# Patient Record
Sex: Male | Born: 1980 | Race: Black or African American | Hispanic: No | Marital: Single | Smoking: Former smoker
Health system: Southern US, Community
[De-identification: ages and names within clinical notes are randomized; demographics above are authoritative.]

## PROBLEM LIST (undated history)

## (undated) DIAGNOSIS — S2249XA Multiple fractures of ribs, unspecified side, initial encounter for closed fracture: Secondary | ICD-10-CM

## (undated) DIAGNOSIS — S36113A Laceration of liver, unspecified degree, initial encounter: Secondary | ICD-10-CM

## (undated) DIAGNOSIS — J942 Hemothorax: Secondary | ICD-10-CM

## (undated) DIAGNOSIS — J869 Pyothorax without fistula: Secondary | ICD-10-CM

## (undated) DIAGNOSIS — W3400XA Accidental discharge from unspecified firearms or gun, initial encounter: Secondary | ICD-10-CM

## (undated) HISTORY — DX: Multiple fractures of ribs, unspecified side, initial encounter for closed fracture: S22.49XA

## (undated) HISTORY — DX: Pyothorax without fistula: J86.9

## (undated) HISTORY — DX: Hemothorax: J94.2

## (undated) HISTORY — DX: Accidental discharge from unspecified firearms or gun, initial encounter: W34.00XA

## (undated) HISTORY — DX: Hypocalcemia: E83.51

## (undated) HISTORY — PX: OTHER SURGICAL HISTORY: SHX169

## (undated) HISTORY — PX: DIAGNOSTIC LAPAROSCOPY: SUR761

## (undated) HISTORY — DX: Laceration of liver, unspecified degree, initial encounter: S36.113A

---
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
## 2000-07-17 ENCOUNTER — Emergency Department (HOSPITAL_COMMUNITY): Admission: EM | Admit: 2000-07-17 | Discharge: 2000-07-17 | Payer: Self-pay

## 2009-02-26 ENCOUNTER — Emergency Department (HOSPITAL_COMMUNITY): Admission: EM | Admit: 2009-02-26 | Discharge: 2009-02-26 | Payer: Self-pay | Admitting: Emergency Medicine

## 2011-03-19 ENCOUNTER — Emergency Department (HOSPITAL_COMMUNITY): Payer: BC Managed Care – PPO

## 2011-03-19 ENCOUNTER — Encounter (HOSPITAL_COMMUNITY): Payer: Self-pay

## 2011-03-19 ENCOUNTER — Inpatient Hospital Stay (HOSPITAL_COMMUNITY)
Admission: EM | Admit: 2011-03-19 | Discharge: 2011-04-03 | DRG: 793 | Disposition: A | Discharge status: Home / Self Care | Payer: BC Managed Care – PPO | Attending: Surgery | Admitting: Surgery

## 2011-03-19 DIAGNOSIS — B9689 Other specified bacterial agents as the cause of diseases classified elsewhere: Secondary | ICD-10-CM | POA: Diagnosis present

## 2011-03-19 DIAGNOSIS — K560 Paralytic ileus: Secondary | ICD-10-CM | POA: Diagnosis not present

## 2011-03-19 DIAGNOSIS — S2249XA Multiple fractures of ribs, unspecified side, initial encounter for closed fracture: Secondary | ICD-10-CM

## 2011-03-19 DIAGNOSIS — S61509A Unspecified open wound of unspecified wrist, initial encounter: Secondary | ICD-10-CM | POA: Diagnosis present

## 2011-03-19 DIAGNOSIS — S27809A Unspecified injury of diaphragm, initial encounter: Secondary | ICD-10-CM | POA: Diagnosis present

## 2011-03-19 DIAGNOSIS — S270XXA Traumatic pneumothorax, initial encounter: Secondary | ICD-10-CM

## 2011-03-19 DIAGNOSIS — S36119A Unspecified injury of liver, initial encounter: Secondary | ICD-10-CM

## 2011-03-19 DIAGNOSIS — E876 Hypokalemia: Secondary | ICD-10-CM | POA: Diagnosis present

## 2011-03-19 DIAGNOSIS — R7881 Bacteremia: Secondary | ICD-10-CM | POA: Diagnosis present

## 2011-03-19 DIAGNOSIS — N179 Acute kidney failure, unspecified: Secondary | ICD-10-CM | POA: Diagnosis not present

## 2011-03-19 DIAGNOSIS — J45909 Unspecified asthma, uncomplicated: Secondary | ICD-10-CM | POA: Diagnosis present

## 2011-03-19 DIAGNOSIS — D72829 Elevated white blood cell count, unspecified: Secondary | ICD-10-CM | POA: Diagnosis present

## 2011-03-19 DIAGNOSIS — S2190XA Unspecified open wound of unspecified part of thorax, initial encounter: Principal | ICD-10-CM | POA: Diagnosis present

## 2011-03-19 DIAGNOSIS — S36116A Major laceration of liver, initial encounter: Secondary | ICD-10-CM | POA: Diagnosis present

## 2011-03-19 DIAGNOSIS — S21309A Unspecified open wound of unspecified front wall of thorax with penetration into thoracic cavity, initial encounter: Secondary | ICD-10-CM

## 2011-03-19 DIAGNOSIS — J869 Pyothorax without fistula: Secondary | ICD-10-CM | POA: Diagnosis present

## 2011-03-19 DIAGNOSIS — K929 Disease of digestive system, unspecified: Secondary | ICD-10-CM | POA: Diagnosis not present

## 2011-03-19 DIAGNOSIS — R000 Tachycardia, unspecified: Secondary | ICD-10-CM | POA: Diagnosis not present

## 2011-03-19 DIAGNOSIS — D62 Acute posthemorrhagic anemia: Secondary | ICD-10-CM | POA: Diagnosis not present

## 2011-03-19 LAB — CBC
HCT: 38.9 % — ABNORMAL LOW (ref 39.0–52.0)
Hemoglobin: 13.7 g/dL (ref 13.0–17.0)
MCH: 27.7 pg (ref 26.0–34.0)
MCHC: 35.2 g/dL (ref 30.0–36.0)
MCV: 78.6 fL (ref 78.0–100.0)
Platelets: 207 K/uL (ref 150–400)
RBC: 4.95 MIL/uL (ref 4.22–5.81)
RDW: 12.5 % (ref 11.5–15.5)
WBC: 12.3 10*3/uL — ABNORMAL HIGH (ref 4.0–10.5)

## 2011-03-19 LAB — COMPREHENSIVE METABOLIC PANEL
Albumin: 3.4 g/dL — ABNORMAL LOW (ref 3.5–5.2)
BUN: 16 mg/dL (ref 6–23)
Calcium: 7.8 mg/dL — ABNORMAL LOW (ref 8.4–10.5)
Creatinine, Ser: 1.06 mg/dL (ref 0.50–1.35)
Total Bilirubin: 0.3 mg/dL (ref 0.3–1.2)
Total Protein: 6 g/dL (ref 6.0–8.3)

## 2011-03-19 LAB — COMPREHENSIVE METABOLIC PANEL WITH GFR
ALT: 97 U/L — ABNORMAL HIGH (ref 0–53)
AST: 81 U/L — ABNORMAL HIGH (ref 0–37)
Alkaline Phosphatase: 61 U/L (ref 39–117)
CO2: 24 meq/L (ref 19–32)
Chloride: 104 meq/L (ref 96–112)
GFR calc Af Amer: >60 mL/min (ref >60)
GFR calc non Af Amer: >60 mL/min (ref >60)
Glucose, Bld: 130 mg/dL — ABNORMAL HIGH (ref 70–99)
Potassium: 3 meq/L — ABNORMAL LOW (ref 3.5–5.1)
Sodium: 138 meq/L (ref 135–145)

## 2011-03-19 LAB — ABO/RH: ABO/RH(D): B POS

## 2011-03-19 LAB — POCT I-STAT, CHEM 8
Calcium, Ion: 1.09 mmol/L — ABNORMAL LOW (ref 1.12–1.32)
Glucose, Bld: 129 mg/dL — ABNORMAL HIGH (ref 70–99)
HCT: 42 % (ref 39.0–52.0)
Hemoglobin: 14.3 g/dL (ref 13.0–17.0)
Potassium: 3.1 meq/L — ABNORMAL LOW (ref 3.5–5.1)

## 2011-03-19 LAB — PROTIME-INR
INR: 1.15 (ref 0.00–1.49)
Prothrombin Time: 14.9 seconds (ref 11.6–15.2)

## 2011-03-19 MED ORDER — IOHEXOL 300 MG/ML  SOLN
80.0000 mL | Freq: Once | INTRAMUSCULAR | Status: AC | PRN
  Administered 2011-03-19: 80 mL via INTRAVENOUS

## 2011-03-20 ENCOUNTER — Inpatient Hospital Stay (HOSPITAL_COMMUNITY): Payer: BC Managed Care – PPO

## 2011-03-20 DIAGNOSIS — E876 Hypokalemia: Secondary | ICD-10-CM

## 2011-03-20 DIAGNOSIS — R509 Fever, unspecified: Secondary | ICD-10-CM

## 2011-03-20 DIAGNOSIS — D62 Acute posthemorrhagic anemia: Secondary | ICD-10-CM

## 2011-03-20 LAB — CBC
Hemoglobin: 11.5 g/dL — ABNORMAL LOW (ref 13.0–17.0)
MCHC: 34 g/dL (ref 30.0–36.0)
RBC: 4.33 MIL/uL (ref 4.22–5.81)
WBC: 14.5 10*3/uL — ABNORMAL HIGH (ref 4.0–10.5)

## 2011-03-20 LAB — MRSA PCR SCREENING: MRSA by PCR: NEGATIVE

## 2011-03-20 LAB — BASIC METABOLIC PANEL
Chloride: 104 mEq/L (ref 96–112)
Creatinine, Ser: 0.96 mg/dL (ref 0.50–1.35)
GFR calc Af Amer: >60 mL/min (ref >60)

## 2011-03-20 LAB — PROTIME-INR
INR: 1.11 (ref 0.00–1.49)
Prothrombin Time: 14.5 seconds (ref 11.6–15.2)

## 2011-03-20 LAB — APTT: aPTT: 23 seconds — ABNORMAL LOW (ref 24–37)

## 2011-03-21 ENCOUNTER — Inpatient Hospital Stay (HOSPITAL_COMMUNITY): Payer: BC Managed Care – PPO

## 2011-03-22 ENCOUNTER — Inpatient Hospital Stay (HOSPITAL_COMMUNITY): Payer: BC Managed Care – PPO

## 2011-03-22 DIAGNOSIS — R0602 Shortness of breath: Secondary | ICD-10-CM

## 2011-03-22 DIAGNOSIS — R000 Tachycardia, unspecified: Secondary | ICD-10-CM

## 2011-03-22 LAB — BASIC METABOLIC PANEL
BUN: 7 mg/dL (ref 6–23)
CO2: 31 mEq/L (ref 19–32)
Chloride: 98 mEq/L (ref 96–112)
Creatinine, Ser: 0.86 mg/dL (ref 0.50–1.35)
GFR calc Af Amer: >60 mL/min (ref >60)
Potassium: 3.8 mEq/L (ref 3.5–5.1)

## 2011-03-22 LAB — URINALYSIS, ROUTINE W REFLEX MICROSCOPIC
Bilirubin Urine: NEGATIVE
Ketones, ur: NEGATIVE mg/dL
Nitrite: NEGATIVE
Specific Gravity, Urine: 1.02 (ref 1.005–1.030)
Urobilinogen, UA: 1 mg/dL (ref 0.0–1.0)

## 2011-03-22 LAB — CBC
HCT: 29.3 % — ABNORMAL LOW (ref 39.0–52.0)
HCT: 27 % — ABNORMAL LOW (ref 39.0–52.0)
Hemoglobin: 9.8 g/dL — ABNORMAL LOW (ref 13.0–17.0)
MCH: 26.5 pg (ref 26.0–34.0)
MCV: 78.8 fL (ref 78.0–100.0)
MCV: 77.8 fL — ABNORMAL LOW (ref 78.0–100.0)
RBC: 3.72 MIL/uL — ABNORMAL LOW (ref 4.22–5.81)
RBC: 3.47 MIL/uL — ABNORMAL LOW (ref 4.22–5.81)
RDW: 12.3 % (ref 11.5–15.5)
RDW: 12.3 % (ref 11.5–15.5)
WBC: 11.8 10*3/uL — ABNORMAL HIGH (ref 4.0–10.5)
WBC: 14.5 10*3/uL — ABNORMAL HIGH (ref 4.0–10.5)
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
## 2011-03-23 ENCOUNTER — Inpatient Hospital Stay (HOSPITAL_COMMUNITY): Payer: BC Managed Care – PPO

## 2011-03-23 LAB — TYPE AND SCREEN
Antibody Screen: NEGATIVE
Unit division: 0
Unit division: 0
Unit division: 0

## 2011-03-23 LAB — CBC
HCT: 25.8 % — ABNORMAL LOW (ref 39.0–52.0)
Hemoglobin: 8.8 g/dL — ABNORMAL LOW (ref 13.0–17.0)
MCH: 26.6 pg (ref 26.0–34.0)
MCHC: 34.1 g/dL (ref 30.0–36.0)
RDW: 12.1 % (ref 11.5–15.5)

## 2011-03-23 LAB — URINE CULTURE: Culture: NO GROWTH

## 2011-03-23 LAB — CULTURE, BLOOD (ROUTINE X 2): Culture  Setup Time: 201209061016

## 2011-03-24 ENCOUNTER — Inpatient Hospital Stay (HOSPITAL_COMMUNITY): Payer: BC Managed Care – PPO

## 2011-03-24 LAB — CBC
Hemoglobin: 8.2 g/dL — ABNORMAL LOW (ref 13.0–17.0)
RBC: 3.07 MIL/uL — ABNORMAL LOW (ref 4.22–5.81)
WBC: 11.5 10*3/uL — ABNORMAL HIGH (ref 4.0–10.5)

## 2011-03-24 LAB — BASIC METABOLIC PANEL
CO2: 29 mEq/L (ref 19–32)
Chloride: 103 mEq/L (ref 96–112)
GFR calc non Af Amer: >60 mL/min (ref >60)
Glucose, Bld: 103 mg/dL — ABNORMAL HIGH (ref 70–99)
Potassium: 3.3 mEq/L — ABNORMAL LOW (ref 3.5–5.1)
Sodium: 136 mEq/L (ref 135–145)

## 2011-03-25 ENCOUNTER — Inpatient Hospital Stay (HOSPITAL_COMMUNITY): Payer: BC Managed Care – PPO

## 2011-03-25 DIAGNOSIS — R042 Hemoptysis: Secondary | ICD-10-CM

## 2011-03-25 LAB — CBC
HCT: 25.2 % — ABNORMAL LOW (ref 39.0–52.0)
Hemoglobin: 8.7 g/dL — ABNORMAL LOW (ref 13.0–17.0)
MCV: 75.9 fL — ABNORMAL LOW (ref 78.0–100.0)
Platelets: 297 10*3/uL (ref 150–400)
RBC: 3.32 MIL/uL — ABNORMAL LOW (ref 4.22–5.81)
WBC: 11.1 10*3/uL — ABNORMAL HIGH (ref 4.0–10.5)

## 2011-03-25 LAB — BASIC METABOLIC PANEL
CO2: 28 mEq/L (ref 19–32)
Chloride: 101 mEq/L (ref 96–112)
Creatinine, Ser: 0.93 mg/dL (ref 0.50–1.35)

## 2011-03-25 MED ORDER — IOHEXOL 300 MG/ML  SOLN: 100.0000 mL | Freq: Once | INTRAMUSCULAR | Status: AC | PRN

## 2011-03-26 DIAGNOSIS — J869 Pyothorax without fistula: Secondary | ICD-10-CM

## 2011-03-26 LAB — CBC
HCT: 24.5 % — ABNORMAL LOW (ref 39.0–52.0)
MCV: 75.4 fL — ABNORMAL LOW (ref 78.0–100.0)
RDW: 12.4 % (ref 11.5–15.5)
WBC: 15.9 10*3/uL — ABNORMAL HIGH (ref 4.0–10.5)

## 2011-03-26 LAB — BASIC METABOLIC PANEL
BUN: 7 mg/dL (ref 6–23)
CO2: 30 mEq/L (ref 19–32)
Chloride: 102 mEq/L (ref 96–112)
Creatinine, Ser: 0.95 mg/dL (ref 0.50–1.35)
GFR calc Af Amer: >60 mL/min (ref >60)

## 2011-03-27 ENCOUNTER — Inpatient Hospital Stay (HOSPITAL_COMMUNITY): Payer: BC Managed Care – PPO

## 2011-03-27 LAB — CBC
HCT: 24.6 % — ABNORMAL LOW (ref 39.0–52.0)
MCH: 26.1 pg (ref 26.0–34.0)
MCHC: 34.6 g/dL (ref 30.0–36.0)
MCV: 75.5 fL — ABNORMAL LOW (ref 78.0–100.0)
RDW: 12.6 % (ref 11.5–15.5)

## 2011-03-28 DIAGNOSIS — J869 Pyothorax without fistula: Secondary | ICD-10-CM

## 2011-03-28 DIAGNOSIS — J90 Pleural effusion, not elsewhere classified: Secondary | ICD-10-CM

## 2011-03-29 ENCOUNTER — Other Ambulatory Visit: Payer: Self-pay | Admitting: Cardiothoracic Surgery

## 2011-03-29 ENCOUNTER — Inpatient Hospital Stay (HOSPITAL_COMMUNITY): Payer: BC Managed Care – PPO

## 2011-03-29 DIAGNOSIS — J869 Pyothorax without fistula: Secondary | ICD-10-CM

## 2011-03-29 HISTORY — PX: OTHER SURGICAL HISTORY: SHX169

## 2011-03-29 LAB — CBC
HCT: 27.2 % — ABNORMAL LOW (ref 39.0–52.0)
Hemoglobin: 9.4 g/dL — ABNORMAL LOW (ref 13.0–17.0)
MCH: 26.5 pg (ref 26.0–34.0)
MCHC: 34.6 g/dL (ref 30.0–36.0)
MCV: 76.6 fL — ABNORMAL LOW (ref 78.0–100.0)
Platelets: 579 10*3/uL — ABNORMAL HIGH (ref 150–400)
RBC: 3.55 MIL/uL — ABNORMAL LOW (ref 4.22–5.81)
RDW: 13.7 % (ref 11.5–15.5)
WBC: 24.9 10*3/uL — ABNORMAL HIGH (ref 4.0–10.5)

## 2011-03-29 LAB — BLOOD GAS, ARTERIAL
Acid-Base Excess: 0.2 mmol/L (ref 0.0–2.0)
Bicarbonate: 24.3 mEq/L — ABNORMAL HIGH (ref 20.0–24.0)
Drawn by: 258031
O2 Content: 4 L/min
O2 Saturation: 91.8 %
Patient temperature: 98.6
TCO2: 25.5 mmol/L (ref 0–100)
pCO2 arterial: 39.2 mmHg (ref 35.0–45.0)
pH, Arterial: 7.409 (ref 7.350–7.450)
pO2, Arterial: 59.3 mmHg — ABNORMAL LOW (ref 80.0–100.0)

## 2011-03-30 ENCOUNTER — Inpatient Hospital Stay (HOSPITAL_COMMUNITY): Payer: BC Managed Care – PPO

## 2011-03-30 LAB — BASIC METABOLIC PANEL
BUN: 14 mg/dL (ref 6–23)
CO2: 27 mEq/L (ref 19–32)
Calcium: 7.2 mg/dL — ABNORMAL LOW (ref 8.4–10.5)
Chloride: 103 mEq/L (ref 96–112)
Creatinine, Ser: 2.05 mg/dL — ABNORMAL HIGH (ref 0.50–1.35)
GFR calc Af Amer: 46 mL/min — ABNORMAL LOW (ref >60)
GFR calc non Af Amer: 38 mL/min — ABNORMAL LOW (ref >60)
Glucose, Bld: 102 mg/dL — ABNORMAL HIGH (ref 70–99)
Potassium: 3.6 mEq/L (ref 3.5–5.1)
Sodium: 136 mEq/L (ref 135–145)

## 2011-03-30 LAB — CBC
HCT: 23.3 % — ABNORMAL LOW (ref 39.0–52.0)
Hemoglobin: 8 g/dL — ABNORMAL LOW (ref 13.0–17.0)
MCH: 26.8 pg (ref 26.0–34.0)
MCHC: 34.3 g/dL (ref 30.0–36.0)
MCV: 78.2 fL (ref 78.0–100.0)
Platelets: 522 10*3/uL — ABNORMAL HIGH (ref 150–400)
RBC: 2.98 MIL/uL — ABNORMAL LOW (ref 4.22–5.81)
RDW: 13.9 % (ref 11.5–15.5)
WBC: 22.7 10*3/uL — ABNORMAL HIGH (ref 4.0–10.5)

## 2011-03-30 LAB — POCT I-STAT 3, ART BLOOD GAS (G3+)
Acid-Base Excess: 2 mmol/L (ref 0.0–2.0)
Bicarbonate: 26.8 meq/L — ABNORMAL HIGH (ref 20.0–24.0)
Patient temperature: 98.9
TCO2: 28 mmol/L (ref 0–100)

## 2011-03-31 ENCOUNTER — Inpatient Hospital Stay (HOSPITAL_COMMUNITY): Payer: BC Managed Care – PPO

## 2011-03-31 LAB — CBC
HCT: 26.5 % — ABNORMAL LOW (ref 39.0–52.0)
Hemoglobin: 8.9 g/dL — ABNORMAL LOW (ref 13.0–17.0)
MCH: 26.6 pg (ref 26.0–34.0)
MCHC: 33.6 g/dL (ref 30.0–36.0)
MCV: 79.1 fL (ref 78.0–100.0)
Platelets: 526 10*3/uL — ABNORMAL HIGH (ref 150–400)
RBC: 3.35 MIL/uL — ABNORMAL LOW (ref 4.22–5.81)
RDW: 14.1 % (ref 11.5–15.5)
WBC: 23.1 10*3/uL — ABNORMAL HIGH (ref 4.0–10.5)

## 2011-03-31 LAB — CROSSMATCH
ABO/RH(D): B POS
Antibody Screen: NEGATIVE
Unit division: 0
Unit division: 0

## 2011-03-31 LAB — COMPREHENSIVE METABOLIC PANEL
ALT: 61 U/L — ABNORMAL HIGH (ref 0–53)
AST: 62 U/L — ABNORMAL HIGH (ref 0–37)
Albumin: 1.4 g/dL — ABNORMAL LOW (ref 3.5–5.2)
Alkaline Phosphatase: 70 U/L (ref 39–117)
BUN: 11 mg/dL (ref 6–23)
CO2: 30 mEq/L (ref 19–32)
Calcium: 7.4 mg/dL — ABNORMAL LOW (ref 8.4–10.5)
Chloride: 104 mEq/L (ref 96–112)
Creatinine, Ser: 2.01 mg/dL — ABNORMAL HIGH (ref 0.50–1.35)
GFR calc Af Amer: 47 mL/min — ABNORMAL LOW (ref >60)
GFR calc non Af Amer: 39 mL/min — ABNORMAL LOW (ref >60)
Glucose, Bld: 126 mg/dL — ABNORMAL HIGH (ref 70–99)
Potassium: 3.4 mEq/L — ABNORMAL LOW (ref 3.5–5.1)
Sodium: 137 mEq/L (ref 135–145)
Total Bilirubin: 0.3 mg/dL (ref 0.3–1.2)
Total Protein: 5.3 g/dL — ABNORMAL LOW (ref 6.0–8.3)

## 2011-04-01 ENCOUNTER — Inpatient Hospital Stay (HOSPITAL_COMMUNITY): Payer: BC Managed Care – PPO

## 2011-04-01 LAB — RENAL FUNCTION PANEL
BUN: 9 mg/dL (ref 6–23)
Calcium: 7.6 mg/dL — ABNORMAL LOW (ref 8.4–10.5)
Glucose, Bld: 98 mg/dL (ref 70–99)
Phosphorus: 2.5 mg/dL (ref 2.3–4.6)

## 2011-04-01 LAB — CBC
HCT: 25.8 % — ABNORMAL LOW (ref 39.0–52.0)
Hemoglobin: 8.8 g/dL — ABNORMAL LOW (ref 13.0–17.0)
MCH: 26.7 pg (ref 26.0–34.0)
MCHC: 34.1 g/dL (ref 30.0–36.0)

## 2011-04-02 ENCOUNTER — Inpatient Hospital Stay (HOSPITAL_COMMUNITY): Payer: BC Managed Care – PPO

## 2011-04-02 LAB — CBC
HCT: 21.9 % — ABNORMAL LOW (ref 39.0–52.0)
MCHC: 33.8 g/dL (ref 30.0–36.0)
MCV: 78.2 fL (ref 78.0–100.0)
RDW: 14.1 % (ref 11.5–15.5)

## 2011-04-02 LAB — BODY FLUID CULTURE: Culture: NO GROWTH

## 2011-04-02 LAB — BASIC METABOLIC PANEL
BUN: 8 mg/dL (ref 6–23)
Chloride: 114 mEq/L — ABNORMAL HIGH (ref 96–112)
Creatinine, Ser: 1.47 mg/dL — ABNORMAL HIGH (ref 0.50–1.35)
GFR calc Af Amer: >60 mL/min (ref >60)
GFR calc non Af Amer: 56 mL/min — ABNORMAL LOW (ref >60)

## 2011-04-02 LAB — CALCIUM, IONIZED: Calcium, Ion: 1.21 mmol/L (ref 1.12–1.32)

## 2011-04-03 ENCOUNTER — Inpatient Hospital Stay (HOSPITAL_COMMUNITY): Payer: BC Managed Care – PPO

## 2011-04-03 LAB — ANAEROBIC CULTURE

## 2011-04-03 LAB — BASIC METABOLIC PANEL
BUN: 9 mg/dL (ref 6–23)
Chloride: 104 mEq/L (ref 96–112)
GFR calc Af Amer: 54 mL/min — ABNORMAL LOW (ref >60)
Potassium: 3.9 mEq/L (ref 3.5–5.1)

## 2011-04-03 LAB — CBC
HCT: 25.7 % — ABNORMAL LOW (ref 39.0–52.0)
Platelets: 740 10*3/uL — ABNORMAL HIGH (ref 150–400)
RDW: 14 % (ref 11.5–15.5)
WBC: 15.3 10*3/uL — ABNORMAL HIGH (ref 4.0–10.5)
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
## 2011-04-09 ENCOUNTER — Ambulatory Visit (INDEPENDENT_AMBULATORY_CARE_PROVIDER_SITE_OTHER): Payer: Self-pay | Admitting: *Deleted

## 2011-04-09 ENCOUNTER — Other Ambulatory Visit: Payer: Self-pay | Admitting: Cardiothoracic Surgery

## 2011-04-09 VITALS — BP 126/72 | HR 72 | Resp 16 | Ht 69.0 in | Wt 160.0 lb

## 2011-04-09 DIAGNOSIS — D381 Neoplasm of uncertain behavior of trachea, bronchus and lung: Secondary | ICD-10-CM

## 2011-04-09 DIAGNOSIS — J869 Pyothorax without fistula: Secondary | ICD-10-CM

## 2011-04-10 DIAGNOSIS — J942 Hemothorax: Secondary | ICD-10-CM

## 2011-04-10 DIAGNOSIS — S2249XA Multiple fractures of ribs, unspecified side, initial encounter for closed fracture: Secondary | ICD-10-CM

## 2011-04-10 DIAGNOSIS — J869 Pyothorax without fistula: Secondary | ICD-10-CM

## 2011-04-10 DIAGNOSIS — S36113A Laceration of liver, unspecified degree, initial encounter: Secondary | ICD-10-CM | POA: Insufficient documentation

## 2011-04-10 DIAGNOSIS — W3400XA Accidental discharge from unspecified firearms or gun, initial encounter: Secondary | ICD-10-CM

## 2011-04-11 ENCOUNTER — Encounter: Payer: Self-pay | Admitting: *Deleted

## 2011-04-11 ENCOUNTER — Ambulatory Visit: Payer: Self-pay | Admitting: Cardiothoracic Surgery

## 2011-04-11 ENCOUNTER — Ambulatory Visit: Payer: BC Managed Care – PPO | Admitting: Cardiothoracic Surgery

## 2011-04-11 ENCOUNTER — Other Ambulatory Visit: Payer: Self-pay | Admitting: *Deleted

## 2011-04-11 NOTE — Progress Notes (Signed)
Craig Santana returns for staple and suture removal of Right VATS site and CT sites x 2. There is also evidence of his laparotomy incision for repair of abdominal organ injuries by another surgeon. I removed the staples and sutures without difficulty. All sites are healing well except for one ct site. The suture was either removed or it has loosened on it's own----the site is open with old clots.  I cleansed the area with Peroxide, then flushed with normal saline, then packed with 1/4" plain nuguaze.  I instructed his sister to do this everyday until seen in followup. They will notify the office for any s/s of infection. Lortab 7.5/500 one or two every 4-6 hrs prn pain called to [facility] [address].

## 2011-04-17 ENCOUNTER — Other Ambulatory Visit: Payer: Self-pay | Admitting: Cardiothoracic Surgery

## 2011-04-17 DIAGNOSIS — J869 Pyothorax without fistula: Secondary | ICD-10-CM
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
## 2011-04-25 ENCOUNTER — Ambulatory Visit (INDEPENDENT_AMBULATORY_CARE_PROVIDER_SITE_OTHER): Payer: Self-pay | Admitting: Cardiothoracic Surgery

## 2011-04-25 ENCOUNTER — Encounter: Payer: Self-pay | Admitting: Cardiothoracic Surgery

## 2011-04-25 ENCOUNTER — Ambulatory Visit
Admission: RE | Admit: 2011-04-25 | Discharge: 2011-04-25 | Disposition: A | Discharge status: Home / Self Care | Payer: BC Managed Care – PPO | Source: Ambulatory Visit | Attending: Cardiothoracic Surgery | Admitting: Cardiothoracic Surgery

## 2011-04-25 VITALS — BP 132/80 | HR 78 | Resp 16 | Ht 69.0 in

## 2011-04-25 DIAGNOSIS — J869 Pyothorax without fistula: Secondary | ICD-10-CM
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
## 2011-04-25 NOTE — Patient Instructions (Signed)
You may resume driving and you may return to work without limitation in 3-4 weeks.

## 2011-05-09 ENCOUNTER — Other Ambulatory Visit: Payer: Self-pay | Admitting: Cardiothoracic Surgery

## 2011-05-09 DIAGNOSIS — J869 Pyothorax without fistula: Secondary | ICD-10-CM

## 2011-05-16 ENCOUNTER — Ambulatory Visit (INDEPENDENT_AMBULATORY_CARE_PROVIDER_SITE_OTHER): Payer: Self-pay | Admitting: Cardiothoracic Surgery

## 2011-05-16 ENCOUNTER — Encounter: Payer: Self-pay | Admitting: Cardiothoracic Surgery

## 2011-05-16 ENCOUNTER — Ambulatory Visit
Admission: RE | Admit: 2011-05-16 | Discharge: 2011-05-16 | Disposition: A | Discharge status: Home / Self Care | Payer: BC Managed Care – PPO | Source: Ambulatory Visit | Attending: Cardiothoracic Surgery | Admitting: Cardiothoracic Surgery

## 2011-05-16 VITALS — BP 134/79 | HR 99 | Resp 16 | Ht 69.0 in | Wt 160.0 lb

## 2011-05-16 DIAGNOSIS — W3400XA Accidental discharge from unspecified firearms or gun, initial encounter: Secondary | ICD-10-CM

## 2011-05-16 DIAGNOSIS — S21109A Unspecified open wound of unspecified front wall of thorax without penetration into thoracic cavity, initial encounter: Secondary | ICD-10-CM

## 2011-05-16 DIAGNOSIS — J869 Pyothorax without fistula: Secondary | ICD-10-CM

## 2011-05-16 DIAGNOSIS — S21139A Puncture wound without foreign body of unspecified front wall of thorax without penetration into thoracic cavity, initial encounter: Secondary | ICD-10-CM

## 2011-05-16 NOTE — Patient Instructions (Signed)
OK to return to work 
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
## 2011-07-27 ENCOUNTER — Other Ambulatory Visit: Payer: Self-pay | Admitting: Cardiothoracic Surgery

## 2011-07-27 DIAGNOSIS — D381 Neoplasm of uncertain behavior of trachea, bronchus and lung: Secondary | ICD-10-CM

## 2011-08-01 ENCOUNTER — Ambulatory Visit: Payer: BC Managed Care – PPO | Admitting: Cardiothoracic Surgery

## 2011-08-29 ENCOUNTER — Other Ambulatory Visit: Payer: Self-pay | Admitting: Cardiothoracic Surgery

## 2011-08-29 DIAGNOSIS — D381 Neoplasm of uncertain behavior of trachea, bronchus and lung: Secondary | ICD-10-CM

## 2011-08-29 DIAGNOSIS — J869 Pyothorax without fistula: Secondary | ICD-10-CM

## 2011-09-05 ENCOUNTER — Ambulatory Visit (INDEPENDENT_AMBULATORY_CARE_PROVIDER_SITE_OTHER): Payer: BC Managed Care – PPO | Admitting: Cardiothoracic Surgery

## 2011-09-05 ENCOUNTER — Encounter: Payer: Self-pay | Admitting: Cardiothoracic Surgery

## 2011-09-05 ENCOUNTER — Ambulatory Visit
Admission: RE | Admit: 2011-09-05 | Discharge: 2011-09-05 | Disposition: A | Discharge status: Home / Self Care | Payer: BC Managed Care – PPO | Source: Ambulatory Visit | Attending: Cardiothoracic Surgery | Admitting: Cardiothoracic Surgery

## 2011-09-05 DIAGNOSIS — Z09 Encounter for follow-up examination after completed treatment for conditions other than malignant neoplasm: Secondary | ICD-10-CM

## 2011-09-05 DIAGNOSIS — J869 Pyothorax without fistula: Secondary | ICD-10-CM
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
## 2011-11-22 IMAGING — CR DG CHEST 2V
2 series · 2 of 2 positions shown · non-contrast
Comparison: None

CLINICAL DATA: Status post VATS.

CHEST - 2 VIEW

[view not recorded (1 of 2)]
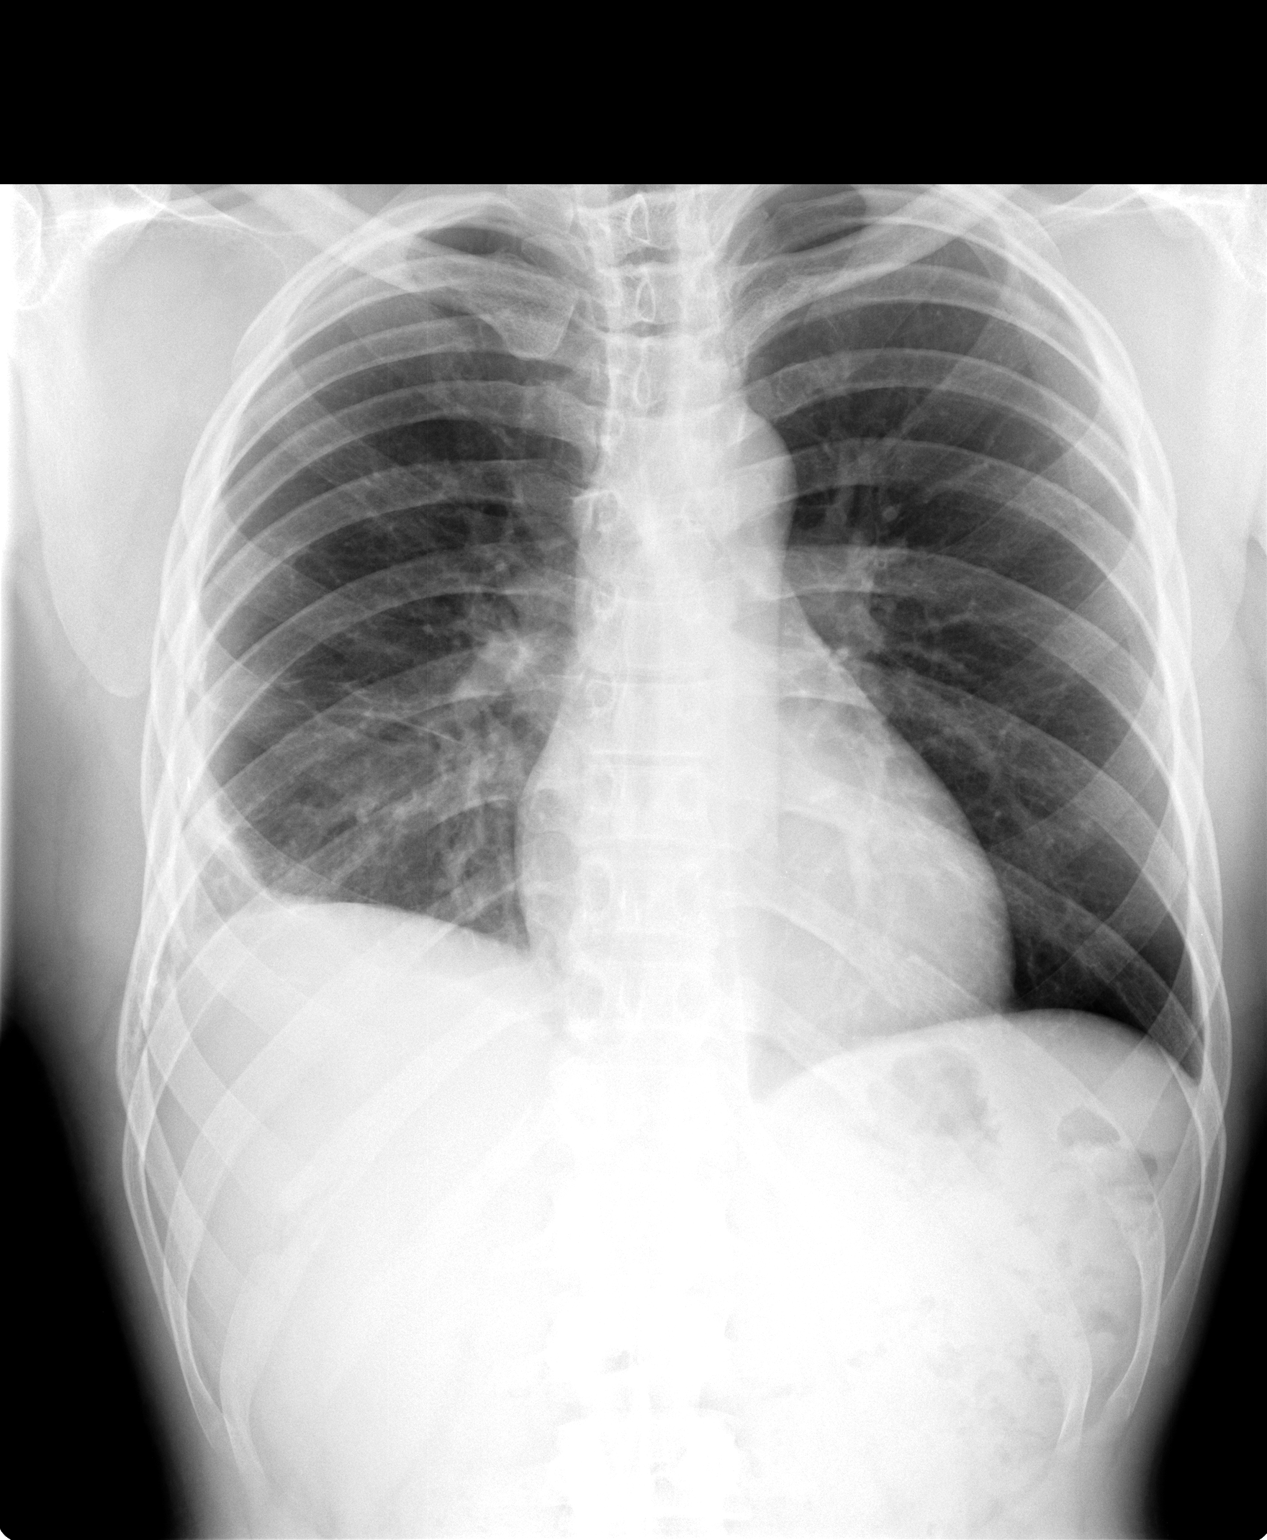

[view not recorded (2 of 2)]
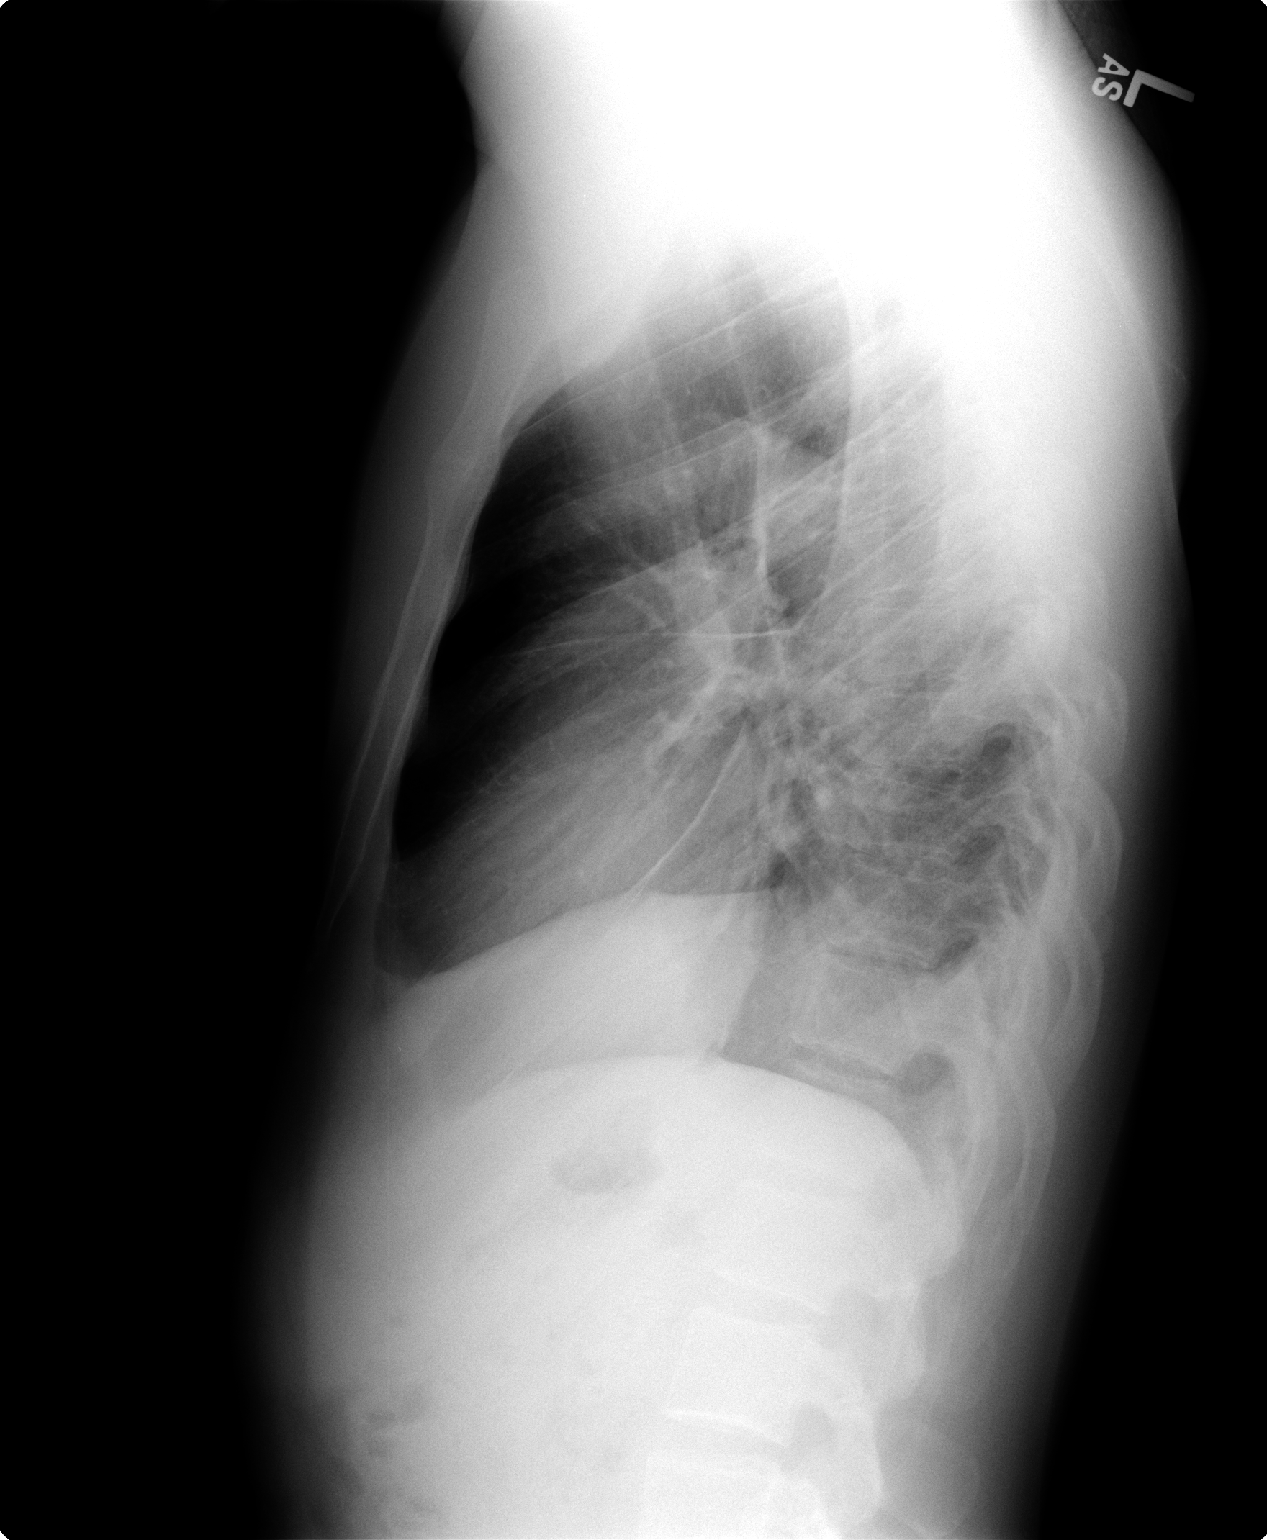

[2 of 2 positions shown; findings below may reference images not displayed]

FINDINGS: The cardiac silhouette, mediastinal and hilar contours
are within normal limits.  The left lung is clear.  The right lung
demonstrates postoperative changes at the right lung base. Probable
surgical rib abnormalities.  No definite pleural effusion or
pneumothorax.  The bony thorax is intact.
IMPRESSION: Surgical scarring changes and atelectasis at the right lung base.
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
## 2012-12-16 IMAGING — CR DG CHEST 1V PORT
1 series · 1 of 1 positions shown · non-contrast
Comparison: Chest CT and plain films 03/19/2011

CLINICAL DATA: Postop.  Post chest tube.

PORTABLE CHEST - 1 VIEW

[AP]
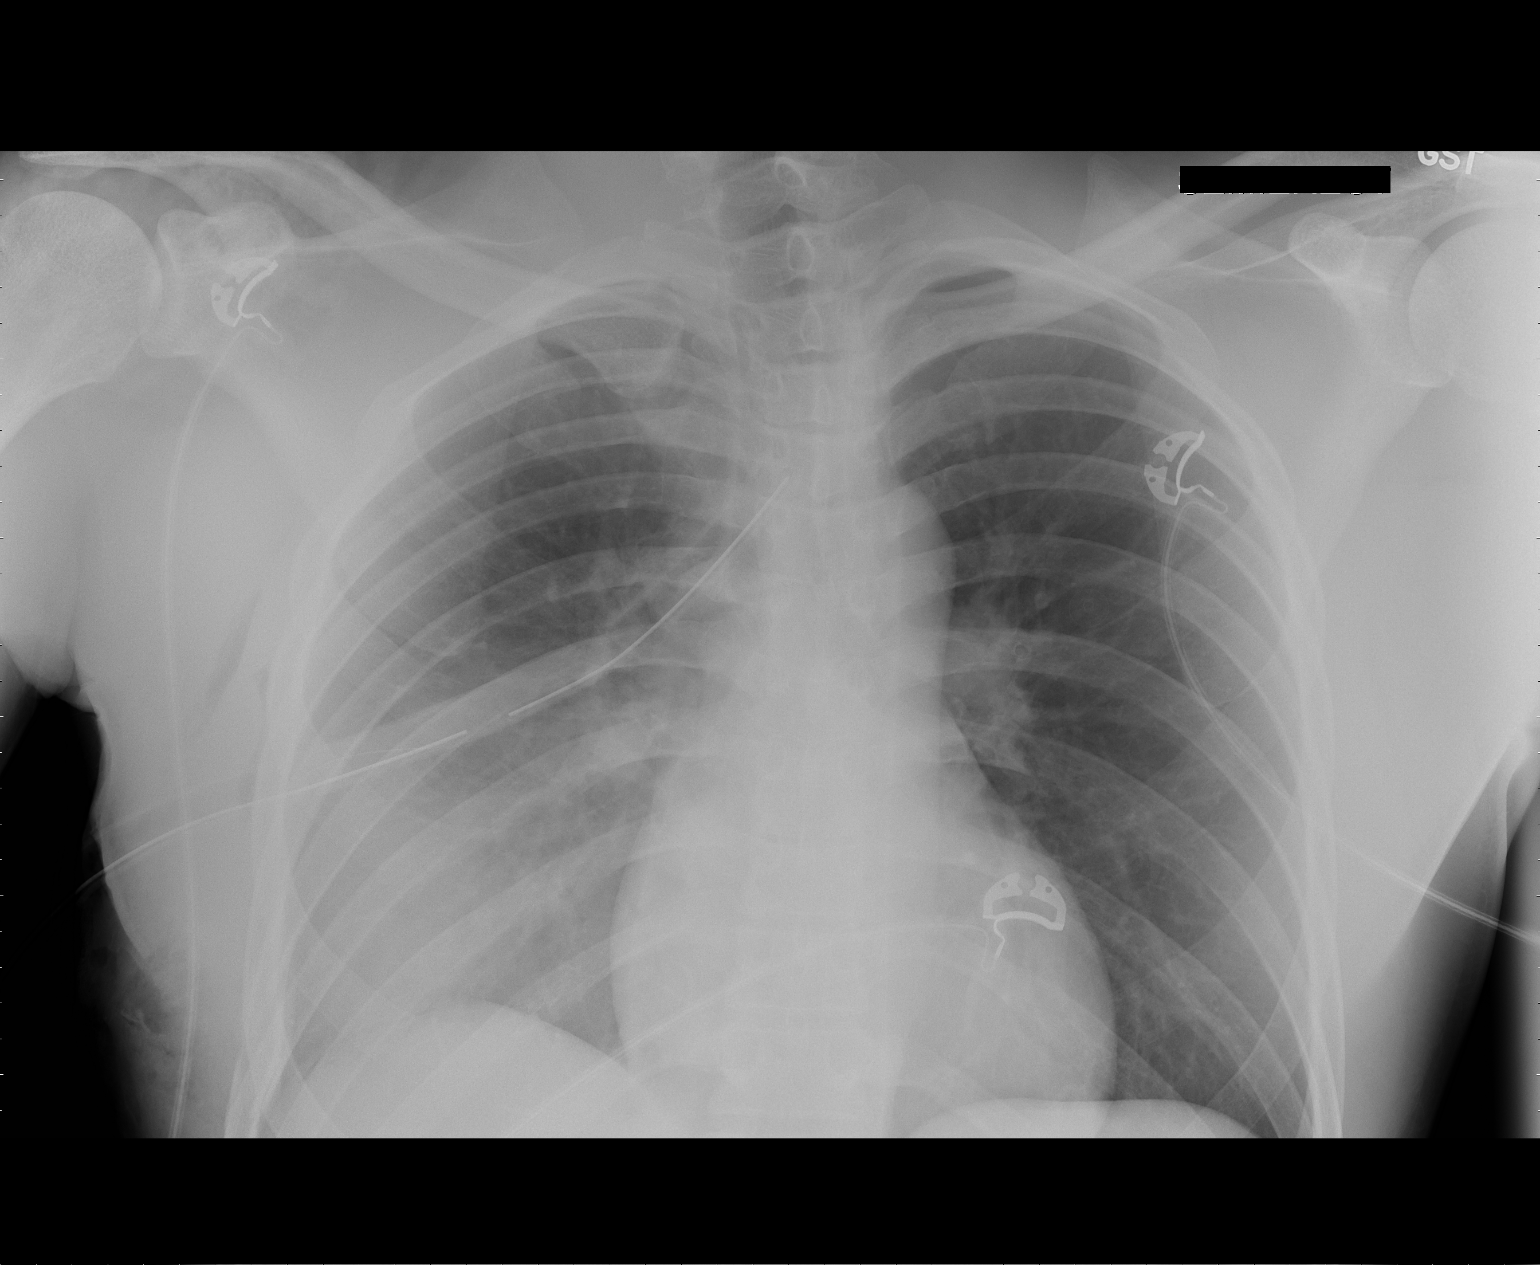

[1 of 1 positions shown; findings below may reference images not displayed]

FINDINGS: The patient has a right-sided chest tube.  There has been
decrease in veiling density of the right hemithorax.  There is
right lateral chest wall gas.  No evidence for pneumothorax.  Left
lung is clear.  Right rib fractures again identified.  Heart size
is normal.  No edema.  Persistent parenchymal opacity at the right
lung base, consistent with contusion.
IMPRESSION: 1.  Interval decrease in veiling density consistent with decreased
effusion or hemothorax.
2.  Suspect contusion of the right lung base.
3.  No evidence for pneumothorax.  The

## 2012-12-16 IMAGING — CR DG CHEST 1V PORT
1 series · 1 of 1 positions shown · non-contrast
Comparison: 03/20/2011 at 9909 hours

CLINICAL DATA: Follow up chest tube

PORTABLE CHEST - 1 VIEW

[view not recorded]
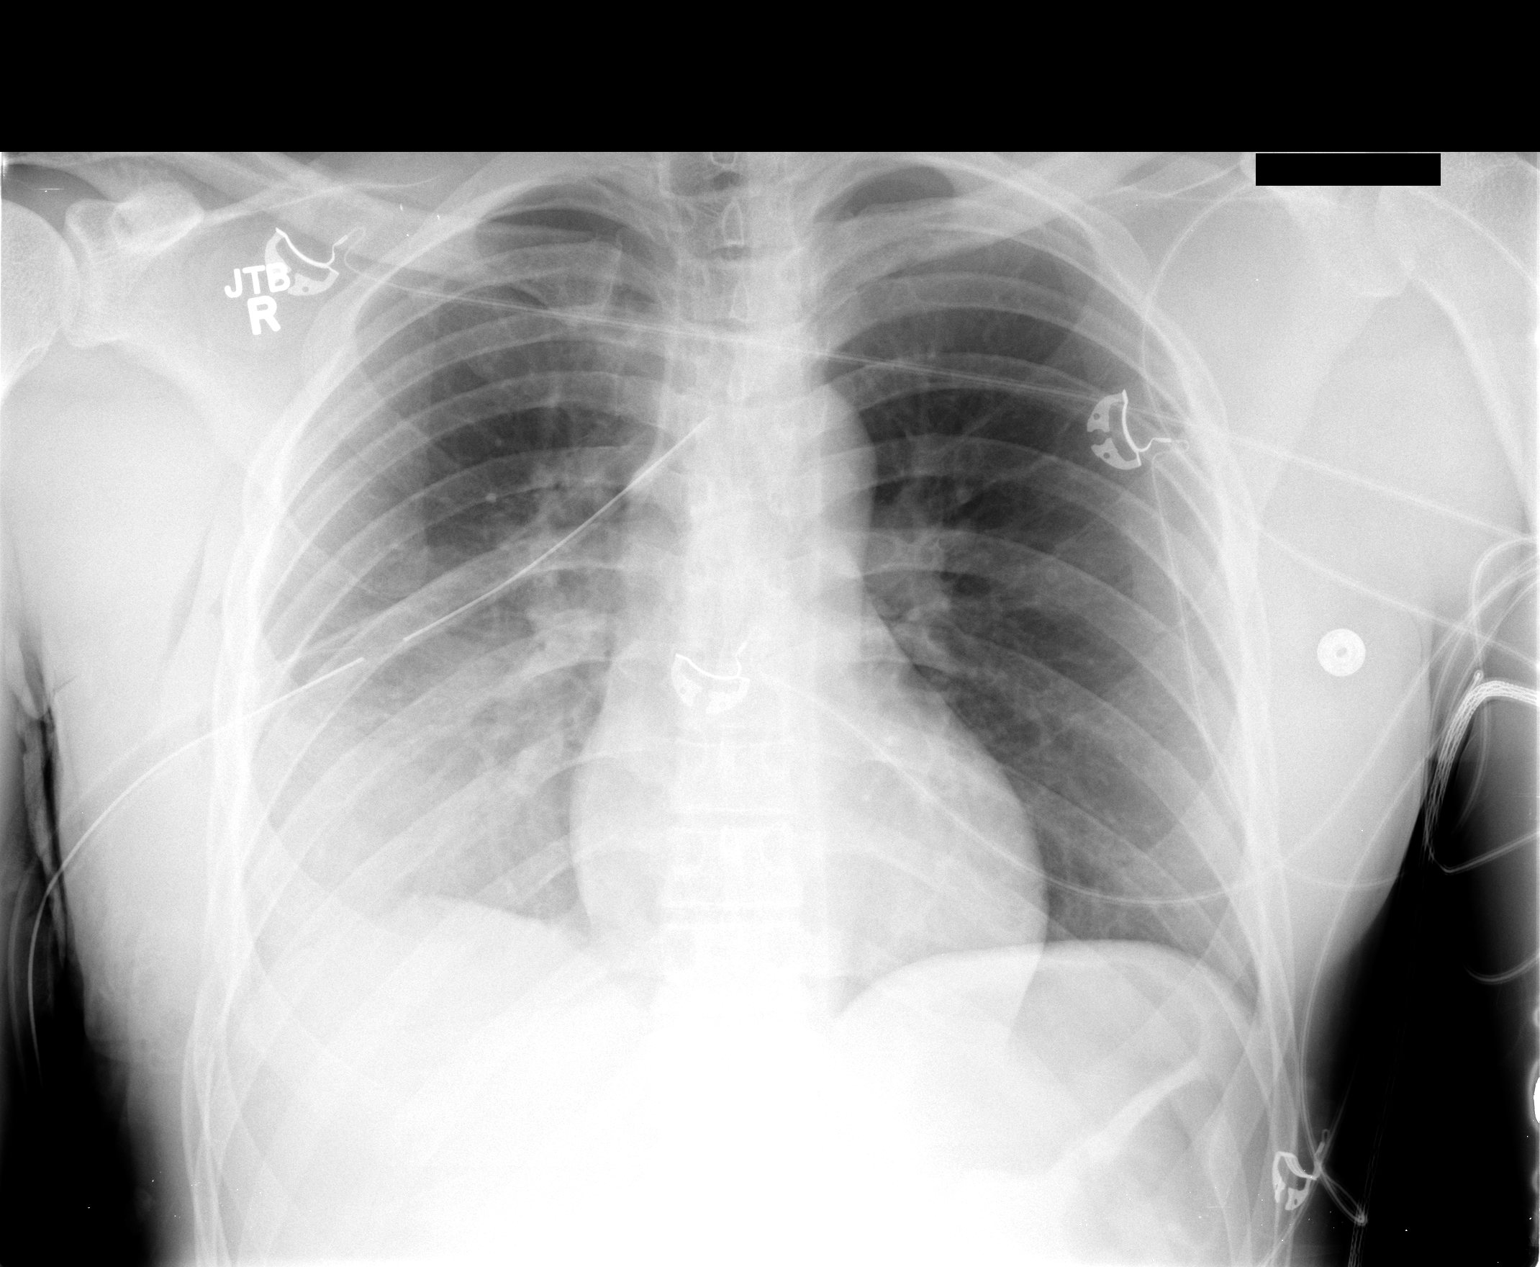

[1 of 1 positions shown; findings below may reference images not displayed]

FINDINGS: Stable right lower lobe opacity/contusion. Superimposed
right pleural effusion.  Left lung is essentially clear.

Stable right chest tube.  No pneumothorax is seen.  Minimal
subcutaneous emphysema along the right lateral chest wall.

The heart is within normal limits.

Right lateral rib fractures.
IMPRESSION: Stable right chest tube.  No pneumothorax is seen.

Stable right lower lobe opacity/contusion.  Right pleural effusion.

## 2017-05-27 ENCOUNTER — Other Ambulatory Visit
Admission: RE | Admit: 2017-05-27 | Discharge: 2017-05-27 | Disposition: A | Discharge status: Home / Self Care | Payer: Worker's Compensation | Source: Other Acute Inpatient Hospital | Attending: Family Medicine | Admitting: Family Medicine
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
